# Patient Record
Sex: Female | Born: 1985 | Race: White | Hispanic: No | Marital: Single
Health system: Midwestern US, Community
[De-identification: ages and names within clinical notes are randomized; demographics above are authoritative.]

## PROBLEM LIST (undated history)

## (undated) DIAGNOSIS — Z3044 Encounter for surveillance of vaginal ring hormonal contraceptive device: Secondary | ICD-10-CM

---
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
## 2006-03-25 IMAGING — CT CT MAXILLOFACIAL WITHOUT CONTRAST
2 series · 16 of 40 positions shown, 20 images · non-contrast
Comparison: none

REASON FOR EXAM: Fall, painful face
COMMENTS:  LMP: Two weeks ago

[Series 2: facial 3.0 h60f · axial · 0.30mm/px · z∈[+336,+468]mm · 13 of 52 slices shown, 17 images]
[im 4/52  brain]
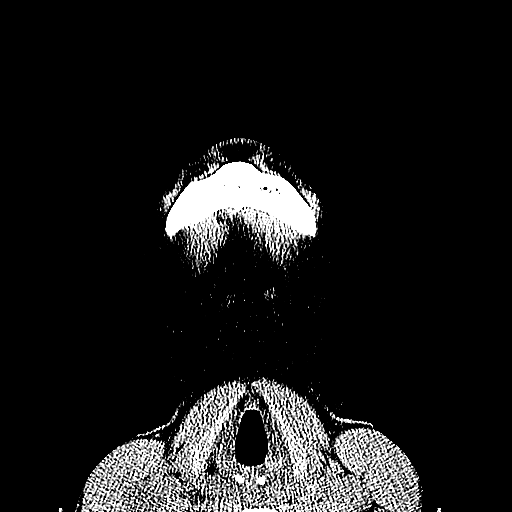
[im 4/52  bone]
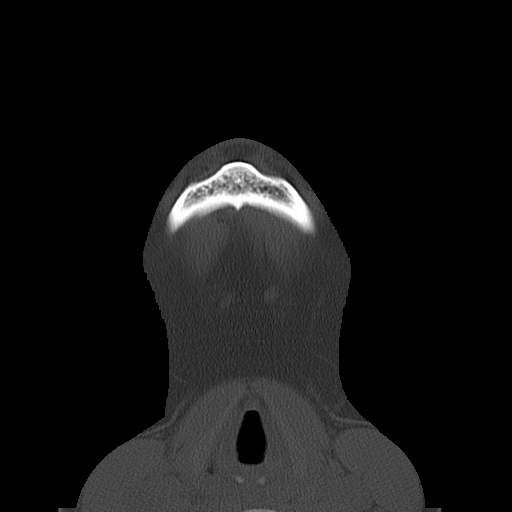
[im 8/52  bone]
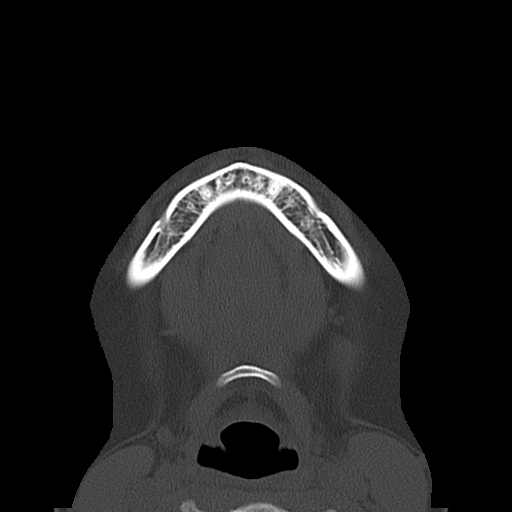
[im 11/52  bone]
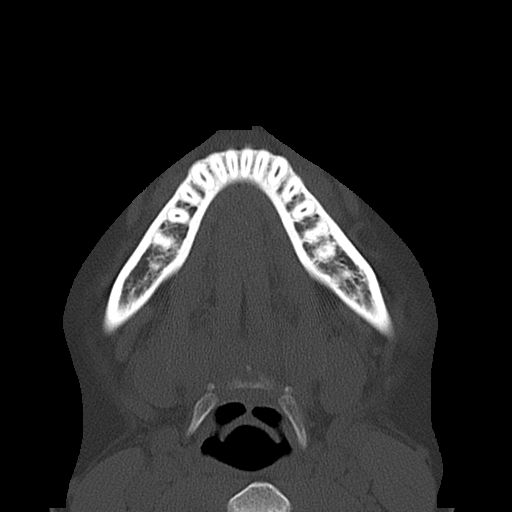
[im 15/52  bone]
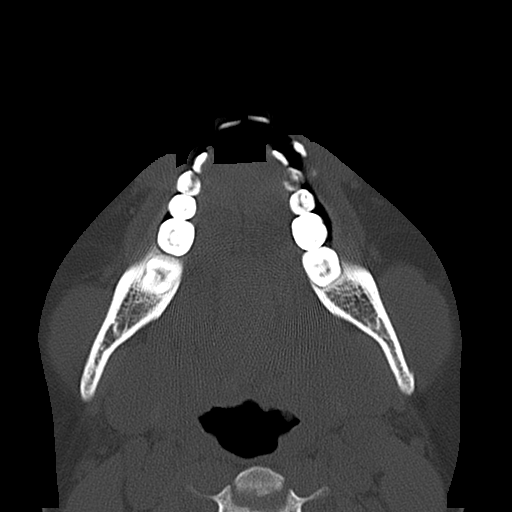
[im 18/52  brain]
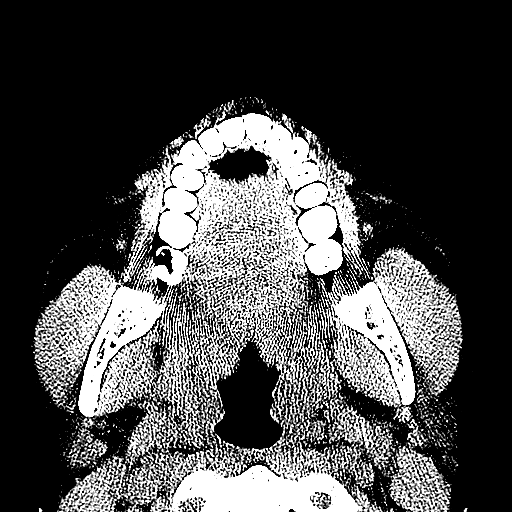
[im 18/52  bone]
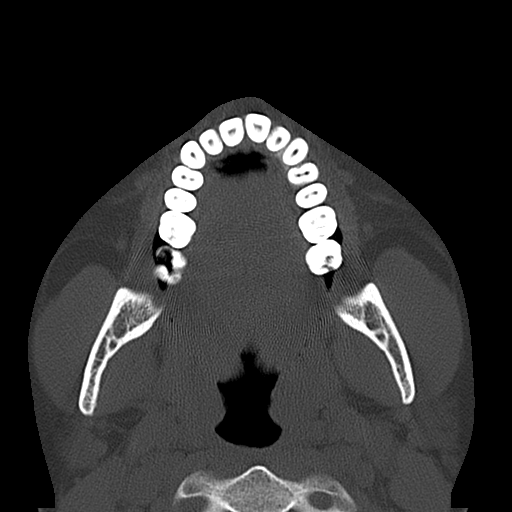
[im 22/52  bone]
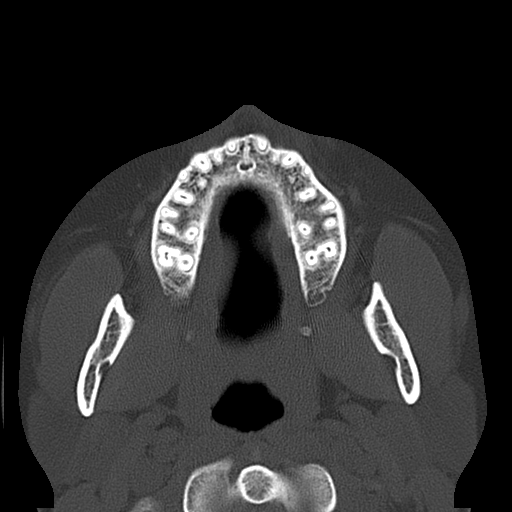
[im 27/52  bone]
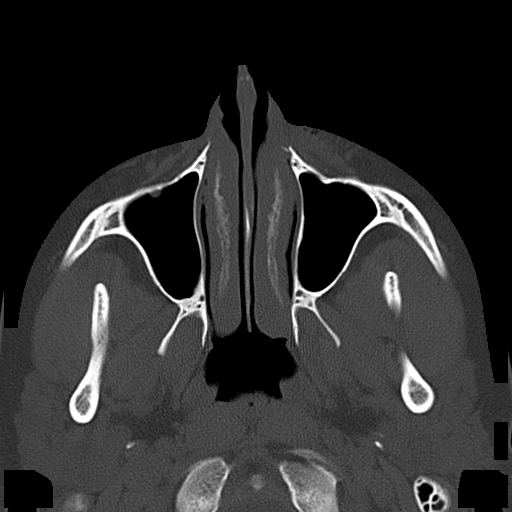
[im 30/52  bone]
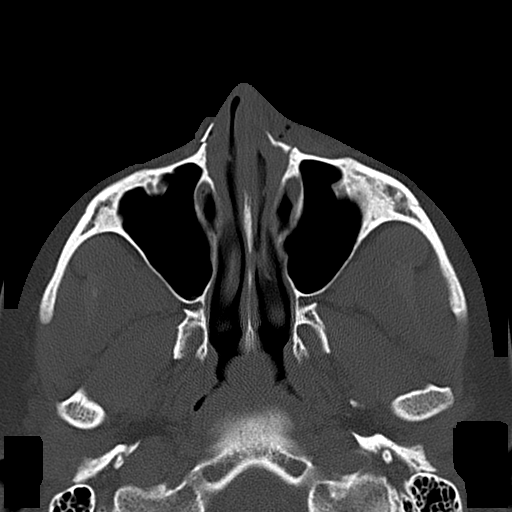
[im 34/52  brain]
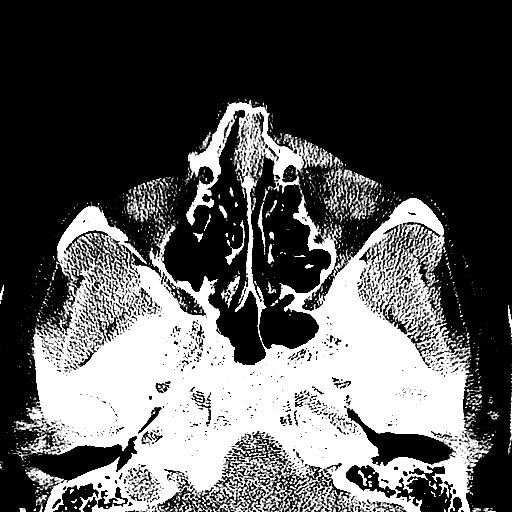
[im 34/52  bone]
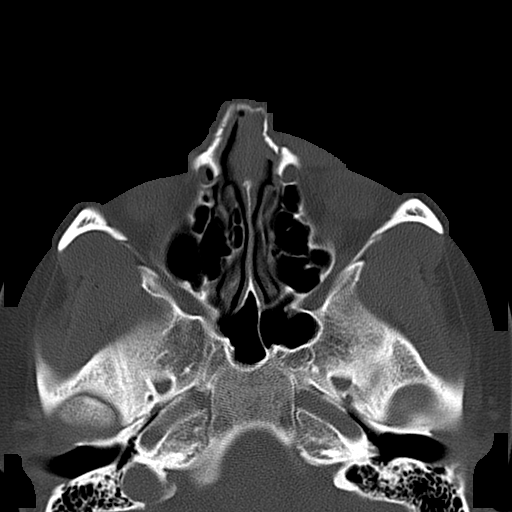
[im 37/52  bone]
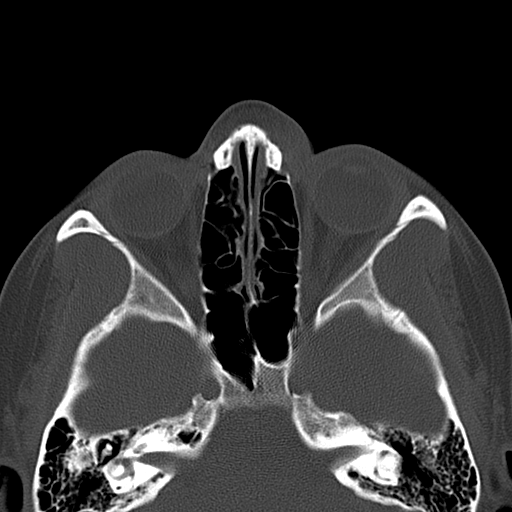
[im 41/52  bone]
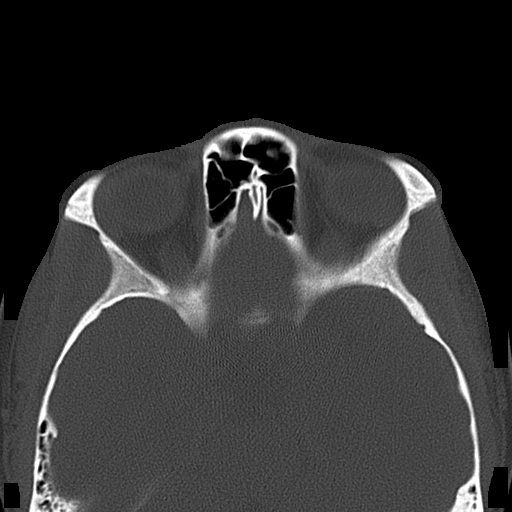
[im 44/52  bone]
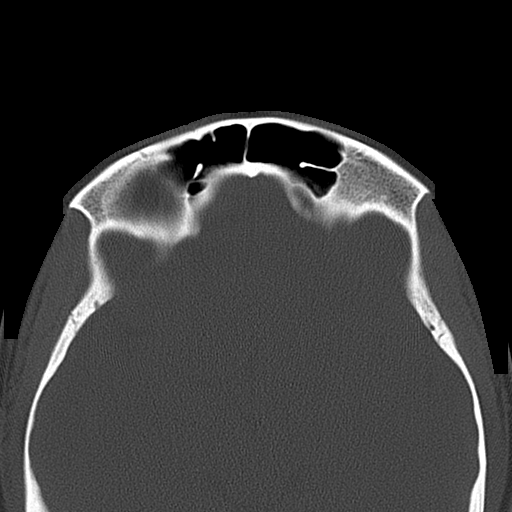
[im 48/52  brain]
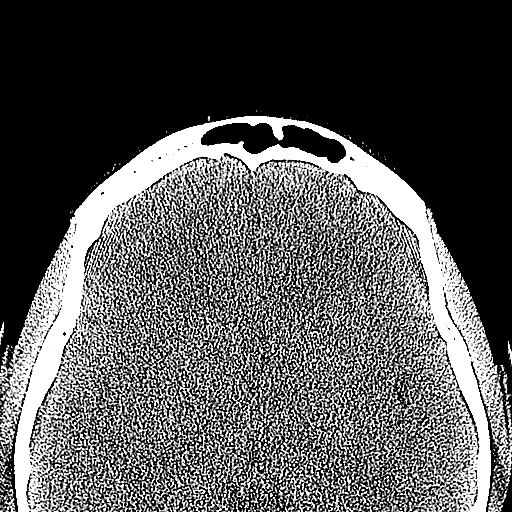
[im 48/52  bone]
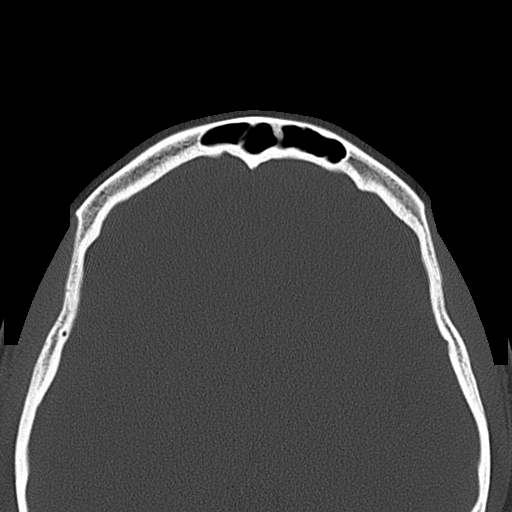

[Series 5: facial 3.0 spo cor · coronal · 0.32mm/px · 3 of 37 slices shown]
[im 13/37  bone]
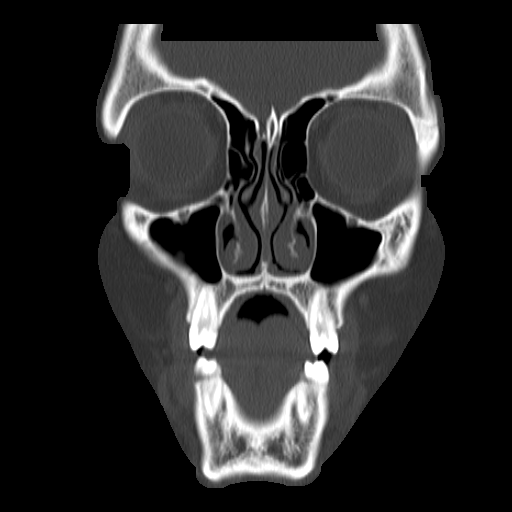
[im 17/37  bone]
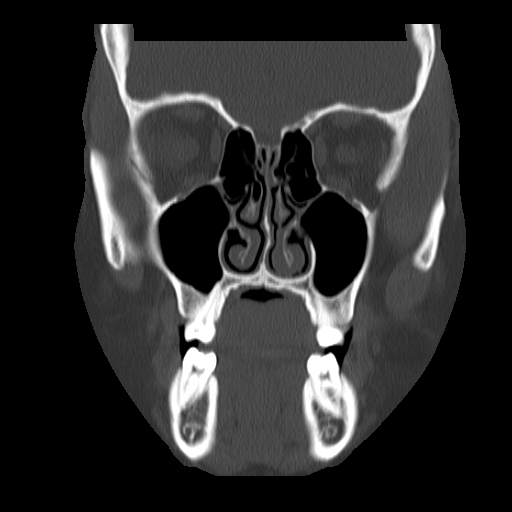
[im 21/37  bone]
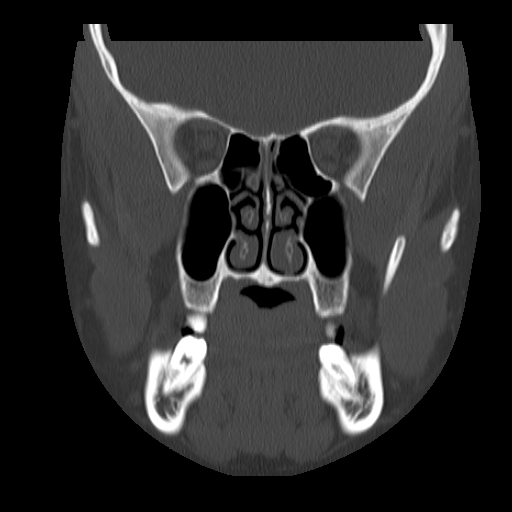

[16 of 40 positions shown; findings below may reference images not displayed]

PROCEDURE:     CT  - CT MAXILLOFACIAL AREA WO  - February 06, 2005  [DATE]

RESULT:     Initial interpretation was rendered by Dr. Charitos Zv of
[REDACTED] on 02/06/2005 at [DATE].

A comminuted, LEFT nasal bone fracture is appreciated which demonstrates
approximately 5.0 mm of depression. A nondepressed, comminuted fracture is
appreciated on the RIGHT. There is a minimally depressed fracture involving
the frontal process of the maxillary bone on the LEFT. There is also a
nondisplaced fracture of the frontal process of the maxillary bone on the
RIGHT. No further fractures or dislocations are appreciated. The paranasal
sinuses are well pneumatized.
IMPRESSION: Nasal bone and frontal process of the maxillary bone
fractures as described above. The fracture on the LEFT is depressed
involving the nasal bone.

## 2006-05-01 ENCOUNTER — Observation Stay: Payer: Self-pay | Admitting: Obstetrics and Gynecology

## 2006-06-28 ENCOUNTER — Inpatient Hospital Stay: Payer: Self-pay | Admitting: Obstetrics and Gynecology

## 2007-06-24 ENCOUNTER — Ambulatory Visit: Payer: Self-pay | Admitting: Internal Medicine

## 2011-12-15 ENCOUNTER — Encounter: Payer: Self-pay | Admitting: Obstetrics & Gynecology

## 2012-05-01 ENCOUNTER — Observation Stay: Payer: Self-pay | Admitting: Obstetrics and Gynecology

## 2012-05-01 LAB — URINALYSIS, COMPLETE
Bilirubin,UR: NEGATIVE
Ketone: NEGATIVE
Ph: 7 (ref 4.5–8.0)
Protein: NEGATIVE
RBC,UR: NONE SEEN /HPF (ref 0–5)
Specific Gravity: 1.002 (ref 1.003–1.030)
Squamous Epithelial: <1

## 2012-06-17 ENCOUNTER — Inpatient Hospital Stay: Payer: Self-pay | Admitting: Obstetrics and Gynecology

## 2012-06-17 LAB — CBC WITH DIFFERENTIAL/PLATELET
Basophil #: 0 10*3/uL (ref 0.0–0.1)
Basophil %: 0.5 %
Eosinophil #: 0.2 10*3/uL (ref 0.0–0.7)
HCT: 29.3 % — ABNORMAL LOW (ref 35.0–47.0)
Lymphocyte #: 1.2 10*3/uL (ref 1.0–3.6)
MCHC: 34.3 g/dL (ref 32.0–36.0)
MCV: 87 fL (ref 80–100)
Monocyte %: 6.3 %
Neutrophil #: 6.2 10*3/uL (ref 1.4–6.5)
Platelet: 164 10*3/uL (ref 150–440)
RDW: 13.5 % (ref 11.5–14.5)
WBC: 8.3 10*3/uL (ref 3.6–11.0)

## 2012-06-17 LAB — PROTEIN / CREATININE RATIO, URINE: Protein/Creat. Ratio: 258 mg/gCREAT — ABNORMAL HIGH (ref 0–200)

## 2012-06-18 LAB — HEMATOCRIT: HCT: 28.5 % — ABNORMAL LOW (ref 35.0–47.0)

## 2013-01-30 IMAGING — US US OB NUCHAL TRANSLUCENCY 1ST GEST - [facility] NRPT
1 series · 14 of 28 positions shown · non-contrast
Comparison: none

[Series 1: us ob nuchal translucency 1st gest - [facility] nrpt · 14 of 32 slices shown]
[im 2/32]
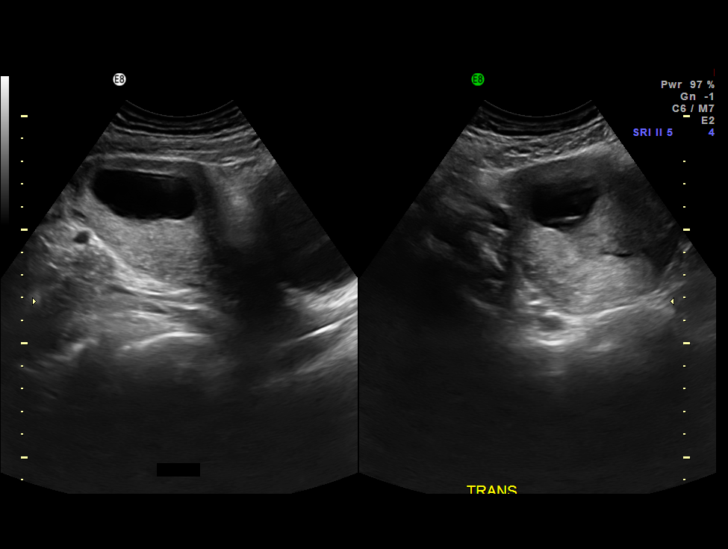
[im 4/32]
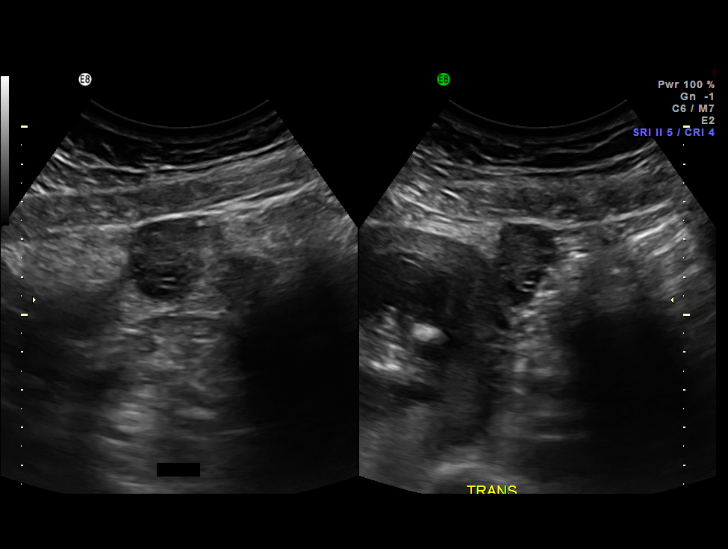
[im 6/32]
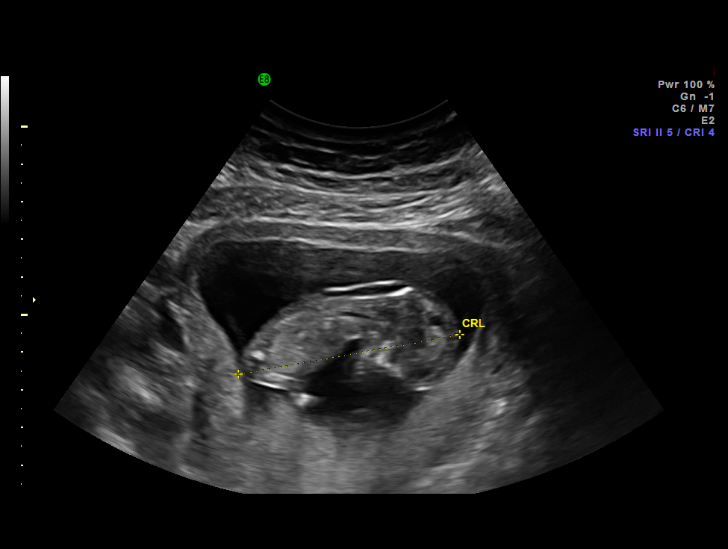
[im 9/32]
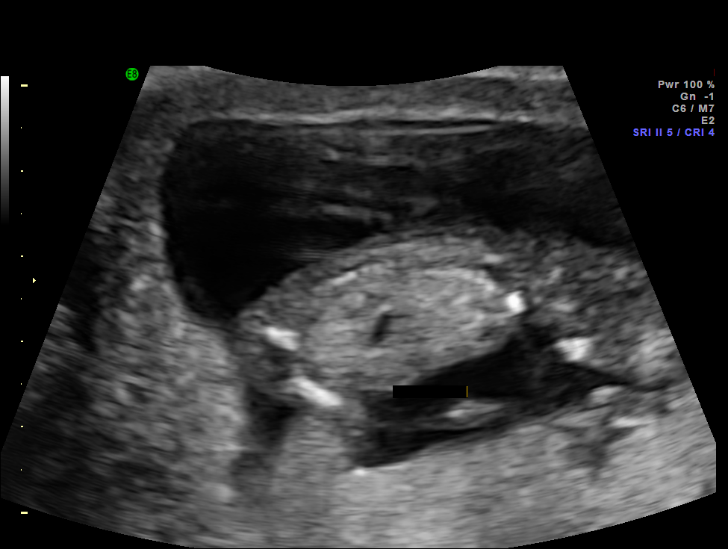
[im 11/32]
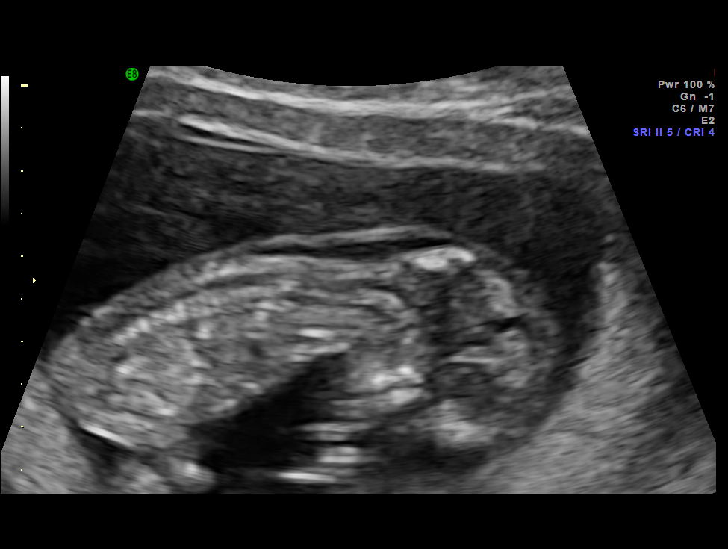
[im 13/32]
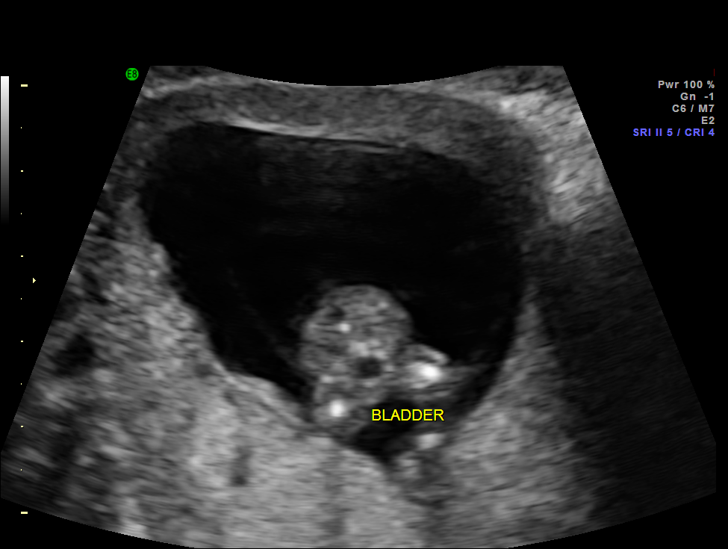
[im 15/32]
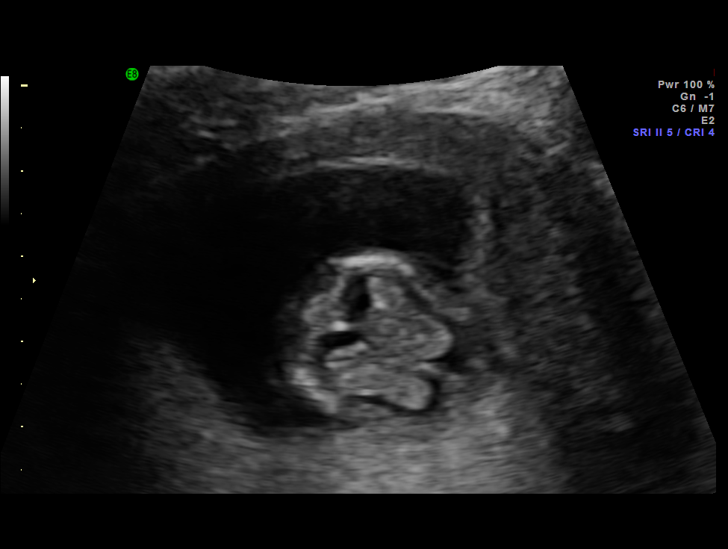
[im 18/32]
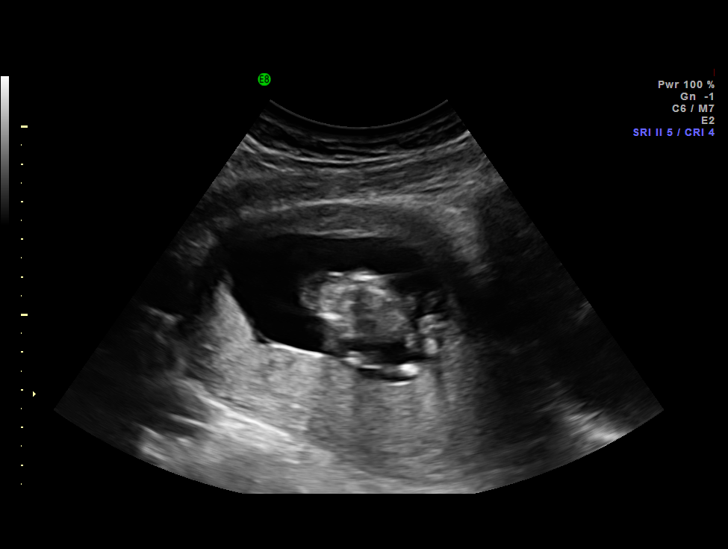
[im 20/32]
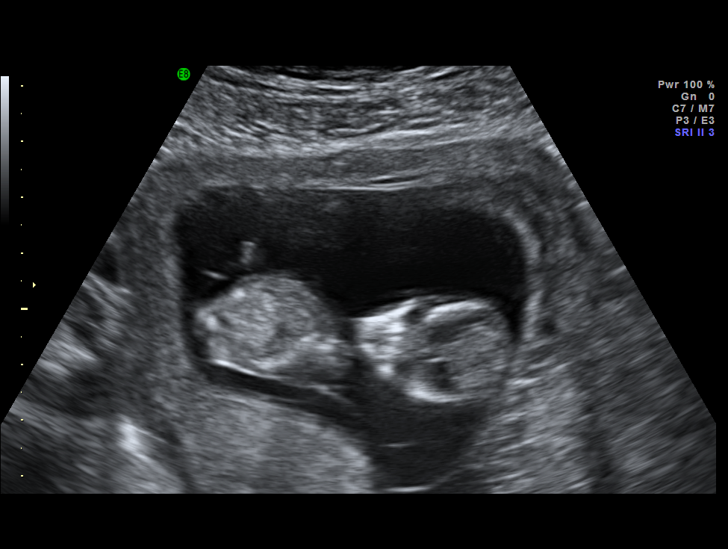
[im 22/32]
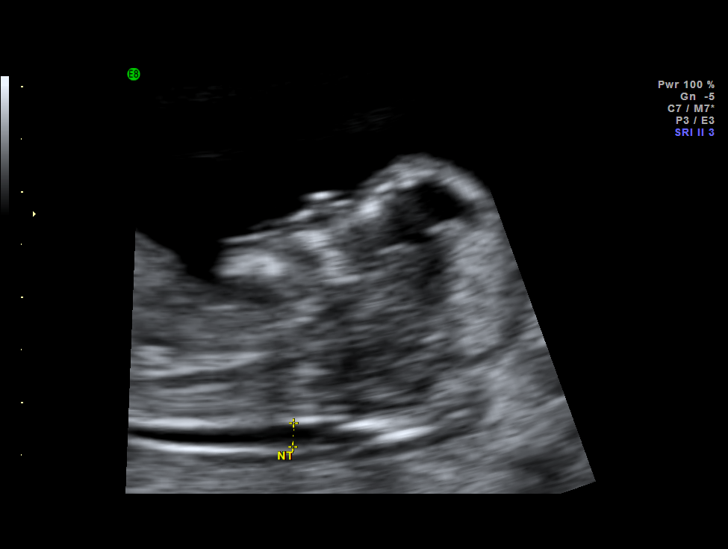
[im 25/32]
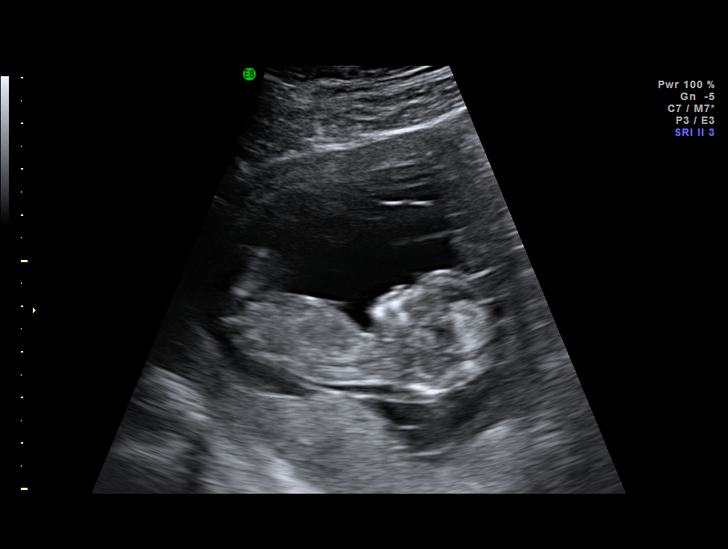
[im 27/32]
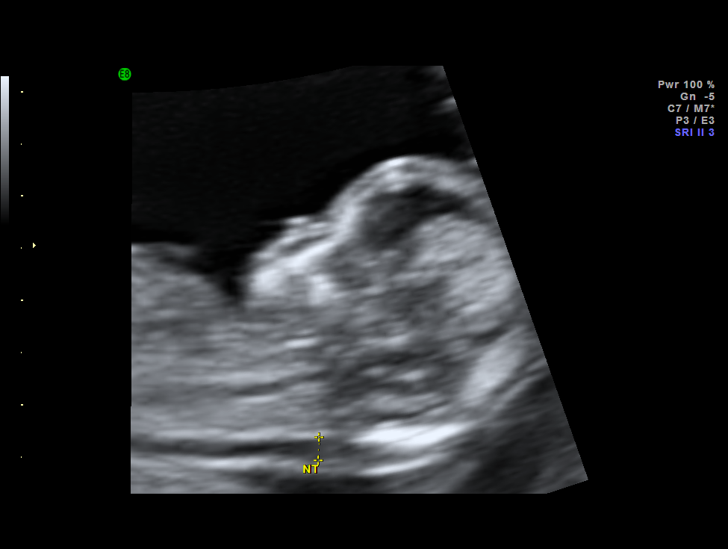
[im 29/32]
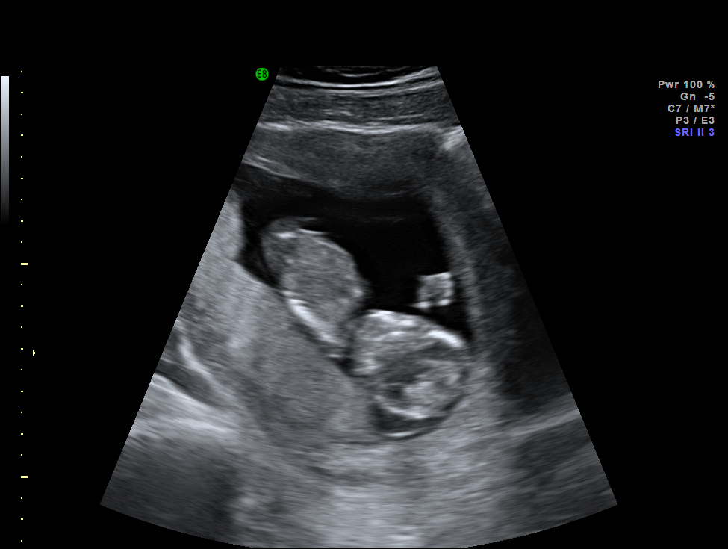
[im 32/32]
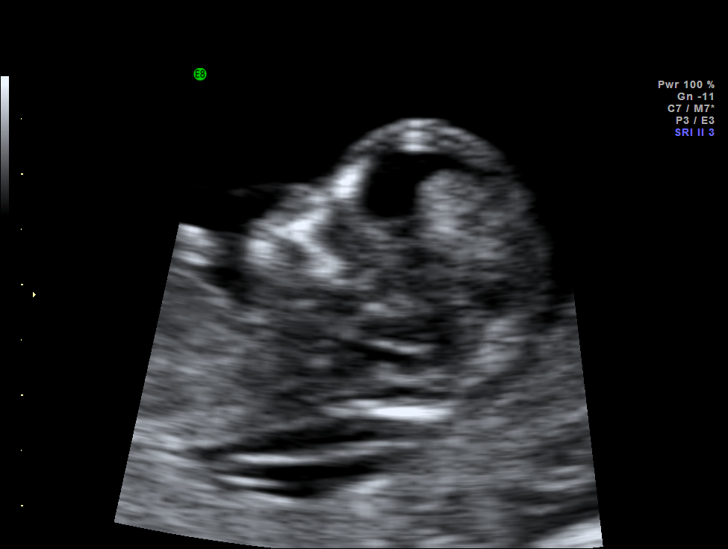

[14 of 28 positions shown; findings below may reference images not displayed]

IMAGES IMPORTED FROM THE SYNGO WORKFLOW SYSTEM
NO DICTATION FOR STUDY

## 2015-02-10 NOTE — H&P (Signed)
L&D Evaluation:  History:   HPI 29 y/o G2P1001 [facility] for scheduled IOL due to elevated blood pressures 140/90 139/90 @ [place] office, denies s sx pre/e, no headache, NV, visual disturbances RUQ or epigastric pain. [facility], denies leaking fluid or vaginal bleeding (small show) baby is active. GBS +    Presents with above    Patient's Medical History No Chronic Illness    Patient's Surgical History none    Medications Pre Natal Vitamins    Allergies NKDA    Social History none    Family History Non-Contributory   ROS:   ROS All systems were reviewed.  HEENT, CNS, GI, GU, Respiratory, CV, Renal and Musculoskeletal systems were found to be normal.   Exam:   Vital Signs stable    Urine Protein to lab    General no apparent distress    Mental Status clear    Chest clear    Heart normal sinus rhythm    Abdomen gravid, tender with contractions    Estimated Fetal Weight Average for gestational age    Fetal Position vtx    Fundal Height term    Back no CVAT    Edema 4+  Pitting    Reflexes 2+    Clonus negative    Pelvic no external lesions, 4cm 50% vtx @ -2 small show BOWI    Mebranes Intact    FHT normal rate with no decels    FHT Description 130's avg variability with accels    Fetal Heart Rate 136    Ucx irregular, q 2/4 mins mild    Skin dry    Lymph no lymphadenopathy   Impression:   Impression IOL elevated B/P @ term   Plan:   Plan antibiotics for GBBS prophylaxis    Comments Will draw PIH labs, protein creatinine ratio. Begin IV ABX and pitocin  (after ABX). Explained what to expect, discussed pain management options, considering epidural. Family supportive at bedside.   Electronic Signatures: Albertina ParrLugiano, Eleni Frank B (CNM)  (Signed 15-Sep-13 11:08)  Authored: L&D Evaluation   Last Updated: 15-Sep-13 11:08 by Albertina ParrLugiano, Emmary Culbreath B (CNM)

## 2016-12-06 NOTE — Procedures (Signed)
Patient: [name]  ID: Lab [id number]  Note: All result statuses are Final unless otherwise noted.    Tests: (1) Pap Smear Cytology Report (Pap Smear)   [facility] Interpretation (see below for details)                              NEG      Patient: Rhonda Wilson, Rhonda Wilson                     Accession Number: [id number]      .               Address: [address]                            SSN: [id number]                        [place]  [address]                     DOB: 05/30/1986                                                            Phone: [phone]                Source: ThinPrep IMAGED                       MR#:              Collected: 12/06/2016  Received: 12/07/2016    Completed: 12/14/2016      Clinical History: No LMP Given, CA Screening      .      Test Results______________________________________________________________                              CYTOLOGY REPORT                          NEG      .      Interpretation:  NEGATIVE FOR INTRAEPITHELIAL LESION OR MALIGNANCY        Smear Quality:   Satisfactory Specimen/Slide Preparation                        Endocervical Component Identified       Recommendation:  Repeat Smears At Your Discretion       .       signed out at:  [facility]                                                  Nicole Peterson CT - 12/14/2016                                    --------------------------------------------                                                             Electronically signed                     technical preparation performed at [facility]      The Pap Smear is a screening test with an irreducible false-negative rate,       the consenquences of which can be minimized by obtaining an annual Pap      Smear.      .      Chart Summary_____________________________________________________________           12/06/2016      [id number]   NEG, Satisfactory Specimen/Slide Preparation, Endocervical Component Identified, Repeat Smears At Your Discretion            08/01/2013    [id number]  NEG, Satisfactory Specimen/Slide Preparation, Endocervical Component Identified, Repeat Smears At Your Discretion      .      Clinician_________________________________________________________________                Farrell OursMaureen Jamiyla Ishee DO                [facility]                [address]                [place], [place]  [id number]                [phone]      .      [facility]  [address]  [place], [place] [id number]  [phone]      .    Note: An exclamation mark (!) indicates a result that was not dispersed into the flowsheet.  Document Creation Date: 12/15/2016 8:40 AM  _______________________________________________________________________    (1) Order result status: Final  Collection or observation date-time: 12/06/2016 00:00:00  Requested date-time: 12/06/2016 00:00:00  Receipt date-time: 12/07/2016 15:06:17  Reported date-time: 12/14/2016 10:42:09  Referring Physician:    Ordering Physician: Farrell OursMaureen Katera Rybka [facility])  [facility]  Source: Lab  Filler Order Number: [id number]  Lab site: Ros[facility] CarpenterSPPA, [facility]      [address]      [place]  [id number]      Electronically Signed by Farrell OursMaureen Shahla Betsill DO on 12/20/2016 at 1:51 PM  ________________________________________________________________________  negative pap. Routine screening recommended. Next pap with HPV testing in 3 years.  Encouraged annual GYN exams.      Electronically Signed by Farrell OursMaureen Norell Brisbin DO on 12/20/2016 at 1:51 PM  ________________________________________________________________________  Normal letter sent.        Electronically Signed by Loyal GamblerJeannine Godin on 12/21/2016 at 8:58 AM  ________________________________________________________________________

## 2017-01-23 NOTE — Progress Notes (Addendum)
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     Last Pap Smear:  NEG  Date:  08/01/2013   LMP: spotting with Nuva ring   Performs monthly SBE     .............................. by Santiago Glad at December 06, 2016 12:05 PM          Visit Type:  Annual Exam  Primary Provider:  Dr. Doroteo Bradford, [place]      History of Present Illness:  31 y/o female    RLQ pain continues  Comes and goes and is worse and severe at times.    Continuous use of Nuvaring   Occ breakthrough bleeding.    Days per week pain:  every day is there but is more uncomfortable at times  Affected by bowel movements and sitting.  Feels pressure when  on right side.   Pain is worse with intercourse - position changes help      Patient Health Questionnaire (PHQ-9) (c) 1999 [employer] Inc[employer]   Usage: DECLINES(patient declines depression screening)      PAP SCREENING  Last Pap: NEG (08/01/2013)    Influenza vaccine not adminstered: vaccine availability      GC/Chlamydia Screen   Is the patient <= 47 years of age? no  Is the patient sexually active? yes    HIV Screen   Is the patient 39-68 years of age? yes    Meds at the time of the visit  NUVARING 0.12-0.015 MG/24HR VAGINAL RING (ETONOGESTREL-ETHINYL ESTRADIOL) Insert 1 ring vaginally Leave in place for 3 weeks then remove for 1 ring free week.  Repeat  * NUVA RING SAMPLE Lot#[id number] 03/20        Allergies at the time of the visit   * CAN TOLERATE PERCOCET (Critical)  CODEINE (Severe)  HYDROCODONE (Severe)        Past Medical History:     Reviewed history from 12/02/2015 and no changes required:        Gardisil 2012- 3 shots          Lactose Intolerance                 Anxiety / Depression - hx of cutting as a child - feels very stable now        Chronic Upper Right Neck Pain     Past Surgical History:     Reviewed history from 02/20/2014 and no changes required:        SVD - 25 Sept 2008        Laparoscopic Right Ovarian Cystectomy -  Oct 2013 - [place]        Wisdom teeth extraction - 2010         Dental Extraction - July 2014        Recovers very slowly from anesthesia          c-section 02/05/2014    Family History Summary:      Reviewed history Last on 02/03/2015 and no changes required:12/06/2016  Mother Baker Pierini.) - Has Family History Unknown - Entered On: 09/03/2013  MGF - Has Family History of Melanoma - Entered On: 07/23/2013  MGM - Has Family History of Diabetes - Entered On: 07/23/2013  MGF - Has Family History of Heart Disease - Entered On: 07/23/2013    General Comments - FH:  FH of cervical cancer -  MGM / MGGM / Maternal Great Aunts    Denies FH of Breast or Ovarian Cancer     Social History:  Reviewed history from 12/02/2015 and no changes required:        Lives with boyfriend Minerva Areola, together since Spring 2014 -  7 yo daughter Jovita Gamma, and Ladona Horns two kids sometimes during the week        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      Vital Signs   Weight: 191 pounds Change since last visit: -11 pounds   BMI: 32.78  Blood Pressure: 122/70 mm Hg  Cuff Size: adult    Nursing Documentation   Vitals performed and patient identified by full name and DOB: Meryle Ready MA December 06, 2016 11:49 AM  Medications reviewed: Meryle Ready MA December 06, 2016 11:49 AM  Allergies reviewed: Meryle Ready MA December 06, 2016 11:49 AM  Influenza vaccine not adminstered: vaccine availability  6    Physical Exam    General:      well developed, well nourished, in no acute distress  Neck:      no masses, thyromegaly, or abnormal cervical nodes  Breasts:      no masses, adenopathy or nipple discharge.    Respiratory:      resp unlabored  Heart:      regular rate  Genitalia:         GU: normal appearing external genitalia, normal appearing, pink vaginal mucusa with rugae, no lesions  Cervix: smooth, pink no evidence of lesions, no CMT  Uterus: non tender with palpation  Adnexa: no discrete masses,  Tenderness in RLQ just above pubic bone         Blood Pressure:  Today's BP: 122/70 mm Hg        Process Orders  Check Orders Results:      Lab (printed in practice): Order checked:       -- Chlamydia GC Molecular    (CTNG) --  [NO CODE FOUND]   Tests Sent for requisitioning (December 06, 2016 12:26 PM):      12/06/2016: Lab (printed in practice) -- Chlamydia GC Molecular    (CTNG) [phone] x2] (signed)              Assessment and Plan:    Problem # 1:  PREVENTIVE HEALTH CARE (ICD-V70.0) (ICD10-Z00.00)  Routien care  STD screen done  Pap completed    Well women exam completed.    Reviewed healthy habits including diet and exercise  Encourage annual GYN exams      Problem # 2:  PELVIC PAIN- RIGHT (ICD-625.9) (ICD10-R10.2)  repeat pelvic US  ?endometriosis/endometrioma.  Will review findings by phone        Orders:  [place] [id number]     Preventative 18 to 39 yrs   [phone]) - Signed  Added new Test order of Chlamydia GC Molecular    (  CTNG) [phone] x2) - Signed  Added new Test order of Pelvis Transabdominal             (Ultrasound) ([address]14) - Signed      ]      Electronically Signed by Farrell Ours DO on 12/06/2016 at 12:28 PM  ________________________________________________________________________

## 2017-01-23 NOTE — Procedures (Addendum)
Patient: Rhonda Wilson  ID: Lab [id number]  Note: All result statuses are Final unless otherwise noted.    Tests: (1) Pap Smear Cytology Report (Pap Smear)   [facility] Interpretation (see below for details)                              NEG      Patient: Rhonda Wilson, Rhonda Wilson                     Accession Number: [id number]      .               Address: [address]                            SSN: [id number]                        [place], [place]  [address]                     DOB: April 05, 1986                                                            Phone: [phone]                Source: ThinPrep IMAGED                       MR#:              Collected: 12/06/2016  Received: 12/07/2016    Completed: 12/14/2016      Clinical History: No LMP Given, CA Screening      .      Test Results______________________________________________________________                              CYTOLOGY REPORT                          NEG      .      Interpretation:  NEGATIVE FOR INTRAEPITHELIAL LESION OR MALIGNANCY        Smear Quality:   Satisfactory Specimen/Slide Preparation                        Endocervical Component Identified       Recommendation:  Repeat Smears At Your Discretion       .       signed out at:  [facility]                                                  Cherlynn Polo CT - 12/14/2016                                    --------------------------------------------  Electronically signed                     technical preparation performed at [facility]      The Pap Smear is a screening test with an irreducible false-negative rate,       the consenquences of which can be minimized by obtaining an annual Pap      Smear.      .      Chart Summary_____________________________________________________________           12/06/2016      [id number]   NEG, Satisfactory Specimen/Slide Preparation, Endocervical Component Identified, Repeat Smears At Your Discretion            08/01/2013    [id number]  NEG, Satisfactory Specimen/Slide Preparation, Endocervical Component Identified, Repeat Smears At Your Discretion      .      Clinician_________________________________________________________________                Farrell Ours DO                [facility]                [address]                [place], [place]  [address]                [phone]      .      [facility]  [address]  [address]  [place], [place] [address]  [phone]      .    Note: An exclamation mark (!) indicates a result that was not dispersed into the flowsheet.  Document Creation Date: 12/15/2016 8:40 AM  _______________________________________________________________________    (1) Order result status: Final  Collection or observation date-time: 12/06/2016 00:00:00  Requested date-time: 12/06/2016 00:00:00  Receipt date-time: 12/07/2016 15:06:17  Reported date-time: 12/14/2016 10:42:09  Referring Physician:    Ordering Physician: Farrell Ours [facility])  Specimen Source: [facility] ThinPrep IMAGED  Source: Lab  Filler Order Number: [id number]  Lab site: [facility]      [address]      [place]  [address]      Electronically Signed by Farrell Ours DO on 12/20/2016 at 1:51 PM  ________________________________________________________________________  negative pap. Routine screening recommended. Next pap with HPV testing in 3 years.  Encouraged annual GYN exams.      Electronically Signed by Farrell Ours DO on 12/20/2016 at 1:51 PM  ________________________________________________________________________  Normal letter sent.        Electronically Signed by Loyal Gambler on 12/21/2016 at 8:58 AM  ________________________________________________________________________

## 2017-03-27 NOTE — Telephone Encounter (Addendum)
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    Contact:  [phone]  Caller understands that [name]  will review for recommendations.

## 2017-03-27 NOTE — Telephone Encounter (Signed)
Please clarify as this is information on a Thyroid US, not pelvic.   Her PCP will need to address this    Ovidio KinMaureen E Bronnie Vasseur, DO  03/27/17  5:10 PM

## 2017-03-28 NOTE — Telephone Encounter (Signed)
Pt name and DOB confirmed by Jacki ConesLaurie at [facility]  Pt of [name]   Jacki ConesLaurie says the thyroid result below were actually intended for this pt.  Names got reversed.     This pt has not had any additional imaging pelvic ultrasound or thyroid ultrasound since March 2018.

## 2017-03-28 NOTE — Telephone Encounter (Signed)
Message entered in error    Ovidio KinMaureen E Chyanna Flock, DO  03/28/17  2:43 PM

## 2017-03-28 NOTE — Telephone Encounter (Signed)
Left message for [facility]   Need confirmation of results.     Rhonda GammaErin Wilson Rhonda Wilson, Certified Medical Assistant  03/28/17  2:00 PM

## 2017-03-28 NOTE — Telephone Encounter (Signed)
Pt name and DOB confirmed by Jacki ConesLaurie at [facility]  Pt of [name]   Calling to clarify message from Dasylva below.  Jacki ConesLaurie says this pt was last seen for pelvic ultrasound on 12/29/16.  She will have to investigate further and call back with clarification  Contact:  [phone]    Caller understands that   [name] will review for recommendations.

## 2017-06-07 ENCOUNTER — Encounter

## 2017-06-07 MED ORDER — ETONOGESTREL 0.12 MG-ETHINYL ESTRADIOL 0.015 MG/24 HR VAGINAL RING
VAGINAL | 0 refills | Status: DC
Start: 2017-06-07 — End: 2017-08-16

## 2017-06-07 NOTE — Telephone Encounter (Signed)
Patient ok to pick up nuvaring samples  Dispense #3    Ovidio KinMaureen E Paisely Brick, DO  06/07/17  4:00 PM

## 2017-06-07 NOTE — Telephone Encounter (Signed)
Pt name and DOB confirmed.  Pt of  Dr. Vedia PereyraPerdue    Pt calling she is on [place] [facility] and her birth Control is going to cost her 160.00 out of pocket. She can't afford this, she would like to know if we can do anything for her.  We do have samples of this in the refrigerator.  Please advise.    Contact: [phone]  Pt understands that  will review for recommendations.

## 2017-06-07 NOTE — Telephone Encounter (Addendum)
Patient will pick up in the morning.   Please sign sample script   Jovita Gammarin S Zanyah Lentsch, CMA  06/07/17  4:16 PM

## 2017-08-16 ENCOUNTER — Encounter

## 2017-08-16 MED ORDER — NORGESTIMATE-ETHINYL ESTRADIOL 0.25 MG-35 MCG TAB
Freq: Every day | ORAL | 0 refills | Status: AC
Start: 2017-08-16 — End: 2017-09-15

## 2017-08-16 MED ORDER — ETONOGESTREL 0.12 MG-ETHINYL ESTRADIOL 0.015 MG/24 HR VAGINAL RING
VAGINAL | 0 refills | Status: DC
Start: 2017-08-16 — End: 2017-11-03

## 2017-08-16 NOTE — Telephone Encounter (Signed)
Patient is requesting an OCP to pay out of pocket as she awaits for the Insurance to kick in, not a refill of Nuva Ring.       Please advise.     Rhonda Wilson, CMA  08/16/17  3:48 PM

## 2017-08-16 NOTE — Telephone Encounter (Signed)
Above noted -  Please advise her that Nuvaring RX refill has been e-faxed for one month as directed.  Please advise her to phone the office with concerns / questions / worsening of her symptoms.    Thank you.   Mosetta Pigeonobert Niclas Markell, DO   08/16/17 - 3:30 PM

## 2017-08-16 NOTE — Telephone Encounter (Signed)
[facility] [address] called and stated the two prescriptions that are compatable to the Nuva ring and cheaper are Sprintec or Tri-Sprintec.  Please advise

## 2017-08-16 NOTE — Telephone Encounter (Signed)
Above noted -  Please advise her that Sprintec RX has been e-faxed as directed.  Please advise her to phone the office with concerns / questions / worsening of her symptoms.    Thank you.   Mosetta Pigeonobert Jehieli Brassell, DO   08/16/17 - 5:06 PM

## 2017-08-16 NOTE — Telephone Encounter (Signed)
Pt name and DOB confirmed.  Pt of Maureen Perdue DO   Pt had called for more Nuvaring samples but was advised    Pt spoke with pharmacist.  Pt is between insurances right now.  Pt due for new Nuvaring on Friday.  New insurance does no start again until 09/02/17.  Pt spoke with her pharmacist but not able to get any help there although pharmacist suggested asking [name] is she could prescribe and interim OCP to hold pt until her insurance kicks in.  Pt will ask pharmacist to [facility] with those options with similar hormones that will only cost pt ~ $20.    [facility] [address]  Contact:  [phone]  Pt understands that oncall Mosetta Pigeonobert Tardif DO   will review for recommendations.

## 2017-08-16 NOTE — Telephone Encounter (Signed)
Above noted -  Please advise her that RX for Sprintec has been e-faxed as directed.  Please advise her to phone the office with concerns / questions / worsening of her symptoms.    Thank you.   Mosetta Pigeonobert Verdon Ferrante, DO   08/16/17 - 5:05 PM

## 2017-11-03 ENCOUNTER — Encounter

## 2017-11-03 MED ORDER — ETONOGESTREL 0.12 MG-ETHINYL ESTRADIOL 0.015 MG/24 HR VAGINAL RING
VAGINAL | 6 refills | Status: DC
Start: 2017-11-03 — End: 2018-06-07

## 2017-11-03 NOTE — Telephone Encounter (Signed)
.  Med Nuva Ring  Last Refill:08-2017  Last Appt. 12-06-2016  Next Appt: Will call back to schedule  [facility] [address]  Contact: [phone]

## 2017-11-09 NOTE — Telephone Encounter (Signed)
Medication sent 11/03/17

## 2018-02-28 ENCOUNTER — Inpatient Hospital Stay
Admit: 2018-02-28 | Discharge: 2018-02-28 | Disposition: A | Discharge status: Home / Self Care | Payer: PRIVATE HEALTH INSURANCE | Attending: Family

## 2018-02-28 DIAGNOSIS — J020 Streptococcal pharyngitis: Secondary | ICD-10-CM

## 2018-02-28 LAB — POC GROUP A STREP
GROUP A STREP SCREEN:: POSITIVE
Group A strep (POC): POSITIVE
KIT LOT NO.:: 8181534 NA
Lot no.: 8181534

## 2018-02-28 MED ORDER — PENICILLIN V POTASSIUM 250 MG/5 ML ORAL SUSP
250 mg/5 mL | Freq: Two times a day (BID) | ORAL | 0 refills | Status: AC
Start: 2018-02-28 — End: 2018-03-10

## 2018-02-28 NOTE — Other (Signed)
Pt states runny nose and sore throat deep voice x3 days aprox.

## 2018-02-28 NOTE — Other (Signed)
32 year old female presents for evaluation of nasal congestion and sore throat. She reports symptoms started 3 days ago and have been worsening.  She reports it is very painful to swallow though she is not experiencing difficulty swallowing or controlling her oral secretions.  No difficulty opening her mouth.   She has not had any fevers, chills, or sweats. She states her voice feels deeper than normal, due to the pain and postnasal drip. She denies sinus pain or facial pain or swelling.  Denies neck stiffness. Denies ear pain.  Denies cough or chest congestion.  She has no abdominal pain, nausea, or changes in bowels.  She vomited once the other day, but has not vomited again since then.  She thinks it was likely due to her stomach being upset from a milkshake.              No skin rashes.  She is otherwise healthy.   Her son has had similar symptoms for the past week.               History reviewed. No pertinent past medical history.     History reviewed. No pertinent surgical history.      History reviewed. No pertinent family history.     Social History     Socioeconomic History   ??? Marital status: SINGLE     Spouse name: Not on file   ??? Number of children: Not on file   ??? Years of education: Not on file   ??? Highest education level: Not on file   Occupational History   ??? Not on file   Social Needs   ??? Financial resource strain: Not on file   ??? Food insecurity:     Worry: Not on file     Inability: Not on file   ??? Transportation needs:     Medical: Not on file     Non-medical: Not on file   Tobacco Use   ??? Smoking status: Current Every Day Smoker     Packs/day: 0.25   ??? Smokeless tobacco: Never Used   Substance and Sexual Activity   ??? Alcohol use: Yes     Comment: rarely   ??? Drug use: Yes     Types: Marijuana   ??? Sexual activity: Not on file   Lifestyle   ??? Physical activity:     Days per week: Not on file     Minutes per session: Not on file   ??? Stress: Not on file   Relationships   ??? Social connections:      Talks on phone: Not on file     Gets together: Not on file     Attends religious service: Not on file     Active member of club or organization: Not on file     Attends meetings of clubs or organizations: Not on file     Relationship status: Not on file   ??? Intimate partner violence:     Fear of current or ex partner: Not on file     Emotionally abused: Not on file     Physically abused: Not on file     Forced sexual activity: Not on file   Other Topics Concern   ??? Not on file   Social History Narrative   ??? Not on file                ALLERGIES: Codeine and Hydrocodone    Review of Systems   Constitutional:  Negative for chills, diaphoresis and fever.   HENT: Positive for congestion, rhinorrhea, sinus pressure and sore throat. Negative for drooling, ear discharge, ear pain, facial swelling, sinus pain and trouble swallowing.    Eyes: Negative.    Respiratory: Negative.    Cardiovascular: Negative.    Gastrointestinal: Negative.    Skin: Negative for rash.       Vitals:    02/28/18 0939   BP: 114/62   Pulse: 94   Resp: 16   Temp: 98.2 ??F (36.8 ??C)   SpO2: 97%       Physical Exam   Nursing note and vitals reviewed.    GENERAL: well-developed, well-nourished, in no distress  HEAD: atraumatic, normocephalic,  non-tender frontal sinuses & maxillary sinuses on percussion;  EYES: PERRL, no eye discharge or injection  EARS: bilateral tympanic membranes pearly gray, non-bulging, normal light reflex; no tenderness, edema, or erythema over the mastoids bilaterally, no tenderness of the tragus , helix, or pinna; ear canal is nonerythematous & nonedematous bilaterally; no pre-auricular or postauricular lymphadenopathy  NOSE: nasal turbinates swollen and red  OROPHARYNX:   Tonsils erythematous and 2+ bilaterally with exudates present  bilaterally; buccal mucosa moist, oropharynx clear; no posterior pharyngeal cobblestoning; uvula midline, airway patent  MOUTH: No gingival edema or erythema.   NECK:  Positive anterior cervical lymphadenopathy bilaterally; supple, full ROM, no stridor, no adenopathy, non-tender,   LUNGS: clear to auscultation bilaterally, no wheezes, rales or rhonchi;   HEART: regular rate and rhythm;   ABDOMEN: Soft & nontender;   EXTREMITY: No peripheral edema; capillary refill <2 seconds  SKIN: Warm and dry; no rashes        MDM  Number of Diagnoses or Management Options  Acute streptococcal pharyngitis:   Diagnosis management comments: Patient afebrile, vitals are stable, patient is nontoxic appearing, and in no acute distress.  Patient appears well hydrated on exam.    Rapid strep positive.  She has no signs of peritonsillar abscess, retropharyngeal abscess, or deeper tissue infection on examination. She has no trismus.  She is controlling her oral secretions without difficulty.  She has cervical lymphadenopathy.  No nuchal rigidity.   Exam is otherwise unremarkable and reassuring.  No evidence of bacterial sinusitis or otitis media.  Lung sounds clear and equal without any respiratory distress. Abdomen is soft and nontender.    Patient will be started on oral penicillin.  Advised to do salt water gargles, Tylenol, and ibuprofen as needed for pain and fevers.    Medication risks,  benefits, actions, and common side effects were reviewed with the patient.      I reviewed return precautions and reasons to seek emergent medical attention. I answered all questions. Patient voiced understanding and agreement with this plan.           Risk of Complications, Morbidity, and/or Mortality  Presenting problems: moderate  Management options: moderate    Patient Progress  Patient progress: stable             Procedures

## 2018-02-28 NOTE — ED Provider Notes (Signed)
32 year old female presents for evaluation of nasal congestion and sore throat. She reports symptoms started 3 days ago and have been worsening.  She reports it is very painful to swallow though she is not experiencing difficulty swallowing or controlling her oral secretions.  No difficulty opening her mouth.   She has not had any fevers, chills, or sweats. She states her voice feels deeper than normal, due to the pain and postnasal drip. She denies sinus pain or facial pain or swelling.  Denies neck stiffness. Denies ear pain.  Denies cough or chest congestion.  She has no abdominal pain, nausea, or changes in bowels.  She vomited once the other day, but has not vomited again since then.  She thinks it was likely due to her stomach being upset from a milkshake.              No skin rashes.  She is otherwise healthy.   Her son has had similar symptoms for the past week.               History reviewed. No pertinent past medical history.     History reviewed. No pertinent surgical history.      History reviewed. No pertinent family history.     Social History     Socioeconomic History   ??? Marital status: SINGLE     Spouse name: Not on file   ??? Number of children: Not on file   ??? Years of education: Not on file   ??? Highest education level: Not on file   Occupational History   ??? Not on file   Social Needs   ??? Financial resource strain: Not on file   ??? Food insecurity:     Worry: Not on file     Inability: Not on file   ??? Transportation needs:     Medical: Not on file     Non-medical: Not on file   Tobacco Use   ??? Smoking status: Current Every Day Smoker     Packs/day: 0.25   ??? Smokeless tobacco: Never Used   Substance and Sexual Activity   ??? Alcohol use: Yes     Comment: rarely   ??? Drug use: Yes     Types: Marijuana   ??? Sexual activity: Not on file   Lifestyle   ??? Physical activity:     Days per week: Not on file     Minutes per session: Not on file   ??? Stress: Not on file   Relationships   ??? Social connections:      Talks on phone: Not on file     Gets together: Not on file     Attends religious service: Not on file     Active member of club or organization: Not on file     Attends meetings of clubs or organizations: Not on file     Relationship status: Not on file   ??? Intimate partner violence:     Fear of current or ex partner: Not on file     Emotionally abused: Not on file     Physically abused: Not on file     Forced sexual activity: Not on file   Other Topics Concern   ??? Not on file   Social History Narrative   ??? Not on file                ALLERGIES: Codeine and Hydrocodone    Review of Systems   Constitutional:  Negative for chills, diaphoresis and fever.   HENT: Positive for congestion, rhinorrhea, sinus pressure and sore throat. Negative for drooling, ear discharge, ear pain, facial swelling, sinus pain and trouble swallowing.    Eyes: Negative.    Respiratory: Negative.    Cardiovascular: Negative.    Gastrointestinal: Negative.    Skin: Negative for rash.       Vitals:    02/28/18 0939   BP: 114/62   Pulse: 94   Resp: 16   Temp: 98.2 ??F (36.8 ??C)   SpO2: 97%       Physical Exam   Nursing note and vitals reviewed.    GENERAL: well-developed, well-nourished, in no distress  HEAD: atraumatic, normocephalic,  non-tender frontal sinuses & maxillary sinuses on percussion;  EYES: PERRL, no eye discharge or injection  EARS: bilateral tympanic membranes pearly gray, non-bulging, normal light reflex; no tenderness, edema, or erythema over the mastoids bilaterally, no tenderness of the tragus , helix, or pinna; ear canal is nonerythematous & nonedematous bilaterally; no pre-auricular or postauricular lymphadenopathy  NOSE: nasal turbinates swollen and red  OROPHARYNX:   Tonsils erythematous and 2+ bilaterally with exudates present  bilaterally; buccal mucosa moist, oropharynx clear; no posterior pharyngeal cobblestoning; uvula midline, airway patent  MOUTH: No gingival edema or erythema.  NECK:  Positive anterior cervical  lymphadenopathy bilaterally; supple, full ROM, no stridor, no adenopathy, non-tender,   LUNGS: clear to auscultation bilaterally, no wheezes, rales or rhonchi;   HEART: regular rate and rhythm;   ABDOMEN: Soft & nontender;   EXTREMITY: No peripheral edema; capillary refill <2 seconds  SKIN: Warm and dry; no rashes        MDM  Number of Diagnoses or Management Options  Acute streptococcal pharyngitis:   Diagnosis management comments: Patient afebrile, vitals are stable, patient is nontoxic appearing, and in no acute distress.  Patient appears well hydrated on exam.    Rapid strep positive.  She has no signs of peritonsillar abscess, retropharyngeal abscess, or deeper tissue infection on examination. She has no trismus.  She is controlling her oral secretions without difficulty.  She has cervical lymphadenopathy.  No nuchal rigidity.   Exam is otherwise unremarkable and reassuring.  No evidence of bacterial sinusitis or otitis media.  Lung sounds clear and equal without any respiratory distress. Abdomen is soft and nontender.    Patient will be started on oral penicillin.  Advised to do salt water gargles, Tylenol, and ibuprofen as needed for pain and fevers.    Medication risks,  benefits, actions, and common side effects were reviewed with the patient.      I reviewed return precautions and reasons to seek emergent medical attention. I answered all questions. Patient voiced understanding and agreement with this plan.           Risk of Complications, Morbidity, and/or Mortality  Presenting problems: moderate  Management options: moderate    Patient Progress  Patient progress: stable             Procedures

## 2018-02-28 NOTE — ED Notes (Signed)
Pt states runny nose and sore throat deep voice x3 days aprox.

## 2018-06-07 ENCOUNTER — Encounter

## 2018-06-11 MED ORDER — ETONOGESTREL 0.12 MG-ETHINYL ESTRADIOL 0.015 MG/24 HR VAGINAL RING
VAGINAL | 4 refills | Status: AC
Start: 2018-06-11 — End: ?

## 2018-09-09 ENCOUNTER — Inpatient Hospital Stay
Admit: 2018-09-09 | Discharge: 2018-09-09 | Disposition: A | Discharge status: Home / Self Care | Payer: PRIVATE HEALTH INSURANCE | Attending: Family

## 2018-09-09 DIAGNOSIS — J36 Peritonsillar abscess: Secondary | ICD-10-CM

## 2018-09-09 LAB — POC GROUP A STREP
GROUP A STREP SCREEN:: NEGATIVE
Group A strep (POC): NEGATIVE
KIT LOT NO.:: 8191206 NA
Lot no.: 8191206

## 2018-09-09 MED ORDER — LACTOBACILLUS RHAMNOSUS GG 10 BILLION CELL-INULIN 200 MG CAPSULE
10 billion cell -200 mg | ORAL_CAPSULE | Freq: Every day | ORAL | 0 refills | Status: AC
Start: 2018-09-09 — End: 2018-12-08

## 2018-09-09 MED ORDER — CLINDAMYCIN 150 MG CAP
150 mg | ORAL | Status: AC
Start: 2018-09-09 — End: 2018-09-09
  Administered 2018-09-09: 22:00:00 via ORAL

## 2018-09-09 MED ORDER — CLINDAMYCIN 300 MG CAP
300 mg | ORAL_CAPSULE | Freq: Four times a day (QID) | ORAL | 0 refills | Status: AC
Start: 2018-09-09 — End: 2018-09-19

## 2018-09-09 MED ORDER — CLINDAMYCIN 150 MG CAP
150 mg | ORAL | Status: AC
Start: 2018-09-09 — End: 2018-09-09
  Administered 2018-09-09: 21:00:00 via ORAL

## 2018-09-09 MED ORDER — DEXAMETHASONE 4 MG TAB
4 mg | ORAL | Status: AC
Start: 2018-09-09 — End: 2018-09-09
  Administered 2018-09-09: 21:00:00 via ORAL

## 2018-09-09 MED FILL — CLINDAMYCIN 150 MG CAP: 150 mg | ORAL | Qty: 1

## 2018-09-09 MED FILL — DEXAMETHASONE 4 MG TAB: 4 mg | ORAL | Qty: 3

## 2018-09-09 MED FILL — CLINDAMYCIN 150 MG CAP: 150 mg | ORAL | Qty: 4

## 2018-09-09 NOTE — Other (Signed)
Otherwise healthy 32 year old female presents for evaluation of sore throat. She reports the sore throat started 2 days ago. Painful on left side only. It worsened yesterday.  She felt fatigued yesterday and had difficulty sleeping because of the pain. She reports it is very painful to swallow but is not experiencing difficulty swallowing or controlling her secretions.   Voice is mildly muffled.   She reports she has had sweats, but denies known fevers.  No rigors chills.  No trouble opening her mouth.  She has swollen glands in her neck that are painful, but denies neck stiffness. Has had mild sinus congestion as well.  Denies shortness of breath or difficulty breathing.  Denies chest pain, abdominal pain, nausea, vomiting, changes in bowels.  No skin rashes. She does have a history of recurring strep.  She did take ibuprofen prior to triage.  Otherwise healthy.     She is eating a popsicle in the exam room.              History reviewed. No pertinent past medical history.     History reviewed. No pertinent surgical history.      History reviewed. No pertinent family history.     Social History     Socioeconomic History   ??? Marital status: SINGLE     Spouse name: Not on file   ??? Number of children: Not on file   ??? Years of education: Not on file   ??? Highest education level: Not on file   Occupational History   ??? Not on file   Social Needs   ??? Financial resource strain: Not on file   ??? Food insecurity:     Worry: Not on file     Inability: Not on file   ??? Transportation needs:     Medical: Not on file     Non-medical: Not on file   Tobacco Use   ??? Smoking status: Current Every Day Smoker     Packs/day: 0.25   ??? Smokeless tobacco: Never Used   Substance and Sexual Activity   ??? Alcohol use: Yes     Comment: rarely   ??? Drug use: Yes     Types: Marijuana   ??? Sexual activity: Not on file   Lifestyle   ??? Physical activity:     Days per week: Not on file     Minutes per session: Not on file   ??? Stress: Not on file    Relationships   ??? Social connections:     Talks on phone: Not on file     Gets together: Not on file     Attends religious service: Not on file     Active member of club or organization: Not on file     Attends meetings of clubs or organizations: Not on file     Relationship status: Not on file   ??? Intimate partner violence:     Fear of current or ex partner: Not on file     Emotionally abused: Not on file     Physically abused: Not on file     Forced sexual activity: Not on file   Other Topics Concern   ??? Not on file   Social History Narrative   ??? Not on file                ALLERGIES: Codeine and Hydrocodone    Review of Systems   Constitutional: Positive for diaphoresis and fatigue. Negative for chills  and fever.   HENT: Positive for congestion, sore throat and voice change. Negative for drooling, ear pain, facial swelling, mouth sores, sinus pressure, sinus pain and trouble swallowing.    Eyes: Negative.    Respiratory: Negative.  Negative for cough, shortness of breath, wheezing and stridor.    Cardiovascular: Negative.    Gastrointestinal: Negative.    Musculoskeletal: Negative.  Negative for neck stiffness.   Skin: Negative.    Neurological: Negative.        Vitals:    09/09/18 1451   BP: 112/77   Pulse: 82   Resp: 14   Temp: 98.1 ??F (36.7 ??C)   SpO2: 97%       Physical Exam  Vitals signs and nursing note reviewed.       GENERAL: well-developed, well-nourished, in no distress  HEAD: atraumatic, normocephalic,  non-tender frontal sinuses & maxillary sinuses on percussion;  EYES: PERRL, no eye discharge or injection  EARS: bilateral tympanic membranes pearly gray, non-bulging, normal light reflex; no tenderness, edema, or erythema over the mastoids bilaterally, no tenderness of the tragus , helix, or pinna; ear canal is nonerythematous & nonedematous bilaterally; no pre-auricular or postauricular lymphadenopathy  NOSE: nasal turbinates non-swollen, moist without redness or discharge   OROPHARYNX:  Left tonsil erythematous; exudates noted on the left tonsil; small exudate noted on right tonsil; left tonsil 2+; right tonsil 1+; buccal mucosa moist, oropharynx clear; no posterior pharyngeal cobblestoning; uvula midline, airway patent; No drooling, no tri-podding  MOUTH: No gingival edema or erythema.  NECK:   Positive anterior cervical lymphadenopathy bilaterally; supple, full ROM, no stridor,   LUNGS: clear to auscultation bilaterally, no wheezes, rales or rhonchi;  no accessory retraction or nasal flaring; good air movement throughout all lung fields  HEART:  Regular rate and rhythm.    ABDOMEN: Abdomen is soft and nontender;    EXTREMITY: Radial pulses 2+. No peripheral edema; capillary refill less than 2 seconds  NEURO:  Alert and oriented x3 Strength is 5 out of 5, sensation grossly intact,   SKIN:  Warm and dry; No rashes, erythema, papules, pustules, or vesicles grossly seen          MDM  Number of Diagnoses or Management Options  Peritonsillar cellulitis:   Diagnosis management comments: This is a 32 year old female who presents for evaluation of very painful left-sided sore throat for the past 2 days as well as fatigue.    Patient afebrile, vitals are stable, patient is nontoxic appearing, and in no acute distress.  Patient appears well hydrated on exam.    Patient shows no signs of respiratory distress in the exam room.  She exhibits no difficulty controlling her secretions.  She is sitting upright.  She does have mild muffling of her voice.   Left tonsils erythematous, edematous, and has exudates.  Right tonsil fairly unremarkable. She has cervical lymphadenopathy, left side worse than right.  Uvula is midline.  Airway is patent.  She has no trismus or difficulty controlling her secretions.   No meningismus. No evidence of a deeper tissue infection, retropharyngeal abscess, or epiglottitis.   The left anterior pillar appears erythematous and indurated.  There is no  fluctuance.  It is not bulging anteriorly to suggest abscess.  I do not suspect peritonsillar abscess, however I am concerned patient has a peritonsillar cellulitis.     After discussion of risks and benefits, will proceed with treatment with clindamycin.  Patient given loading dose of 600 mg here in the urgent  care clinic prior to departure.  She will be prescribed a 10 day course of clindamycin.  She is also given a 1 time dose of dexamethasone.  I have advised her to use a probiotic for 3 months to help prevent antibiotic associated diarrhea and C difficile.  Have also advised her to gargle ibuprofen elixir 400 mg every 6 hr before swallowing, as this has a topical anesthetic and anti-inflammatory effect.   Discussed the need for very close follow-up with her PCP tomorrow.  Counseled to go to the emergency department for any new or worsening symptoms. Reviewed symptoms and signs that warrant emergent medical attention.    Medication risks,  benefits, actions, and common side effects were reviewed with the patient and/or guardian.  Patient denies any chance of pregnancy.     The plan was developed with patient and/or guardian after reviewing risks and benefits, of medication/medical therapy options.  Patient and/or guardian agrees with the plan of care.  All questions, concerns addressed and answered.  Advised on signs and symptoms warranting follow-up or emergency care, see after visit summary instructions for recommended symptomatic care, further details regarding follow up, and plan of care.      Patient/guardian was offered written discharge instructions and declined these.                  Procedures

## 2018-09-09 NOTE — Other (Signed)
Patient called in stating she has had some difficulty keeping her clindamycin down, and has vomited up doses she took over night and first thing this morning.     She was able to keep the dose down that she took 5 min ago so far, but is starting to feel secure stomachache and.    She reports that for the past 15 min her face is been hot and flushed red.  She reports she has also had some new shortness of breath since this morning.  Denies lip swelling, tongue swelling, or sensation of throat swelling.  Denies wheezing.   I advised patient she may be having a medication reaction and needs emergent medical attention if she is having shortness of breath.   She is talking clearly in full sentences without muffling of voice. I recommended she call 911  To go to the emergency department by EMS for further evaluation.    She tells me she is unable to afford ambulance.  I advised my medical advice is to call ambulance and seek emergent medical attention if she is having shortness of breath,   As this could be a sign of a severe anaphylactic reaction and I cannot assess her over the phone..  I discussed risk of worsening severe allergic reaction which could result in permanent deformity, death, or harm to others if she drives herself to the emergency department.    She voices understanding of these risks. She states she will go to the emergency department.

## 2018-09-09 NOTE — Other (Signed)
Patient reports Friday waking up to a sore throat that went away, Saturday afternoon she became fatigued and had difficulty sleeping, her sore throat, white patches, and hoarse voice. She has been using ibuprofen.

## 2018-09-09 NOTE — ED Notes (Signed)
Patient reports Friday waking up to a sore throat that went away, Saturday afternoon she became fatigued and had difficulty sleeping, her sore throat, white patches, and hoarse voice. She has been using ibuprofen.

## 2018-09-09 NOTE — ED Provider Notes (Signed)
Otherwise healthy 32 year old female presents for evaluation of sore throat. She reports the sore throat started 2 days ago. Painful on left side only. It worsened yesterday.  She felt fatigued yesterday and had difficulty sleeping because of the pain. She reports it is very painful to swallow but is not experiencing difficulty swallowing or controlling her secretions.   Voice is mildly muffled.   She reports she has had sweats, but denies known fevers.  No rigors chills.  No trouble opening her mouth.  She has swollen glands in her neck that are painful, but denies neck stiffness. Has had mild sinus congestion as well.  Denies shortness of breath or difficulty breathing.  Denies chest pain, abdominal pain, nausea, vomiting, changes in bowels.  No skin rashes. She does have a history of recurring strep.  She did take ibuprofen prior to triage.  Otherwise healthy.     She is eating a popsicle in the exam room.              History reviewed. No pertinent past medical history.     History reviewed. No pertinent surgical history.      History reviewed. No pertinent family history.     Social History     Socioeconomic History   ??? Marital status: SINGLE     Spouse name: Not on file   ??? Number of children: Not on file   ??? Years of education: Not on file   ??? Highest education level: Not on file   Occupational History   ??? Not on file   Social Needs   ??? Financial resource strain: Not on file   ??? Food insecurity:     Worry: Not on file     Inability: Not on file   ??? Transportation needs:     Medical: Not on file     Non-medical: Not on file   Tobacco Use   ??? Smoking status: Current Every Day Smoker     Packs/day: 0.25   ??? Smokeless tobacco: Never Used   Substance and Sexual Activity   ??? Alcohol use: Yes     Comment: rarely   ??? Drug use: Yes     Types: Marijuana   ??? Sexual activity: Not on file   Lifestyle   ??? Physical activity:     Days per week: Not on file     Minutes per session: Not on file   ??? Stress: Not on file    Relationships   ??? Social connections:     Talks on phone: Not on file     Gets together: Not on file     Attends religious service: Not on file     Active member of club or organization: Not on file     Attends meetings of clubs or organizations: Not on file     Relationship status: Not on file   ??? Intimate partner violence:     Fear of current or ex partner: Not on file     Emotionally abused: Not on file     Physically abused: Not on file     Forced sexual activity: Not on file   Other Topics Concern   ??? Not on file   Social History Narrative   ??? Not on file                ALLERGIES: Codeine and Hydrocodone    Review of Systems   Constitutional: Positive for diaphoresis and fatigue. Negative for chills  and fever.   HENT: Positive for congestion, sore throat and voice change. Negative for drooling, ear pain, facial swelling, mouth sores, sinus pressure, sinus pain and trouble swallowing.    Eyes: Negative.    Respiratory: Negative.  Negative for cough, shortness of breath, wheezing and stridor.    Cardiovascular: Negative.    Gastrointestinal: Negative.    Musculoskeletal: Negative.  Negative for neck stiffness.   Skin: Negative.    Neurological: Negative.        Vitals:    09/09/18 1451   BP: 112/77   Pulse: 82   Resp: 14   Temp: 98.1 ??F (36.7 ??C)   SpO2: 97%       Physical Exam  Vitals signs and nursing note reviewed.       GENERAL: well-developed, well-nourished, in no distress  HEAD: atraumatic, normocephalic,  non-tender frontal sinuses & maxillary sinuses on percussion;  EYES: PERRL, no eye discharge or injection  EARS: bilateral tympanic membranes pearly gray, non-bulging, normal light reflex; no tenderness, edema, or erythema over the mastoids bilaterally, no tenderness of the tragus , helix, or pinna; ear canal is nonerythematous & nonedematous bilaterally; no pre-auricular or postauricular lymphadenopathy  NOSE: nasal turbinates non-swollen, moist without redness or discharge  OROPHARYNX:  Left tonsil  erythematous; exudates noted on the left tonsil; small exudate noted on right tonsil; left tonsil 2+; right tonsil 1+; buccal mucosa moist, oropharynx clear; no posterior pharyngeal cobblestoning; uvula midline, airway patent; No drooling, no tri-podding  MOUTH: No gingival edema or erythema.  NECK:   Positive anterior cervical lymphadenopathy bilaterally; supple, full ROM, no stridor,   LUNGS: clear to auscultation bilaterally, no wheezes, rales or rhonchi;  no accessory retraction or nasal flaring; good air movement throughout all lung fields  HEART:  Regular rate and rhythm.    ABDOMEN: Abdomen is soft and nontender;    EXTREMITY: Radial pulses 2+. No peripheral edema; capillary refill less than 2 seconds  NEURO:  Alert and oriented x3 Strength is 5 out of 5, sensation grossly intact,   SKIN:  Warm and dry; No rashes, erythema, papules, pustules, or vesicles grossly seen          MDM  Number of Diagnoses or Management Options  Peritonsillar cellulitis:   Diagnosis management comments: This is a 32 year old female who presents for evaluation of very painful left-sided sore throat for the past 2 days as well as fatigue.    Patient afebrile, vitals are stable, patient is nontoxic appearing, and in no acute distress.  Patient appears well hydrated on exam.    Patient shows no signs of respiratory distress in the exam room.  She exhibits no difficulty controlling her secretions.  She is sitting upright.  She does have mild muffling of her voice.   Left tonsils erythematous, edematous, and has exudates.  Right tonsil fairly unremarkable. She has cervical lymphadenopathy, left side worse than right.  Uvula is midline.  Airway is patent.  She has no trismus or difficulty controlling her secretions.   No meningismus. No evidence of a deeper tissue infection, retropharyngeal abscess, or epiglottitis.   The left anterior pillar appears erythematous and indurated.  There is no fluctuance.  It is not bulging anteriorly to  suggest abscess.  I do not suspect peritonsillar abscess, however I am concerned patient has a peritonsillar cellulitis.     After discussion of risks and benefits, will proceed with treatment with clindamycin.  Patient given loading dose of 600 mg here in the urgent  care clinic prior to departure.  She will be prescribed a 10 day course of clindamycin.  She is also given a 1 time dose of dexamethasone.  I have advised her to use a probiotic for 3 months to help prevent antibiotic associated diarrhea and C difficile.  Have also advised her to gargle ibuprofen elixir 400 mg every 6 hr before swallowing, as this has a topical anesthetic and anti-inflammatory effect.   Discussed the need for very close follow-up with her PCP tomorrow.  Counseled to go to the emergency department for any new or worsening symptoms. Reviewed symptoms and signs that warrant emergent medical attention.    Medication risks,  benefits, actions, and common side effects were reviewed with the patient and/or guardian.  Patient denies any chance of pregnancy.     The plan was developed with patient and/or guardian after reviewing risks and benefits, of medication/medical therapy options.  Patient and/or guardian agrees with the plan of care.  All questions, concerns addressed and answered.  Advised on signs and symptoms warranting follow-up or emergency care, see after visit summary instructions for recommended symptomatic care, further details regarding follow up, and plan of care.      Patient/guardian was offered written discharge instructions and declined these.                  Procedures

## 2018-09-09 NOTE — ED Notes (Signed)
Patient called in stating she has had some difficulty keeping her clindamycin down, and has vomited up doses she took over night and first thing this morning.     She was able to keep the dose down that she took 5 min ago so far, but is starting to feel secure stomachache and.    She reports that for the past 15 min her face is been hot and flushed red.  She reports she has also had some new shortness of breath since this morning.  Denies lip swelling, tongue swelling, or sensation of throat swelling.  Denies wheezing.   I advised patient she may be having a medication reaction and needs emergent medical attention if she is having shortness of breath.   She is talking clearly in full sentences without muffling of voice. I recommended she call 911  To go to the emergency department by EMS for further evaluation.    She tells me she is unable to afford ambulance.  I advised my medical advice is to call ambulance and seek emergent medical attention if she is having shortness of breath,   As this could be a sign of a severe anaphylactic reaction and I cannot assess her over the phone..  I discussed risk of worsening severe allergic reaction which could result in permanent deformity, death, or harm to others if she drives herself to the emergency department.    She voices understanding of these risks. She states she will go to the emergency department.

## 2018-09-11 LAB — CULTURE, STREP THROAT

## 2019-05-03 NOTE — Telephone Encounter (Signed)
LM for pt that we could fax the release or mail it to her.  Pt to call office to discuss more.  Para March, CMA

## 2019-05-03 NOTE — Telephone Encounter (Signed)
Pt name and DOB verified.    Pt calling stating that she is moving to [place] and is going to an [facility] out there called [facility] located in [place].     Pt states that they will not see the pt until they get all of her records as pt has an emergency c-section with her first child and wants to have a bvac with her next.     Please advise as pt is unsure how to go about this when it comes to moving them over because the pt is unable to come to office as she lives in [place] and states that she is moving soon.     [phone] (home) [phone] (work)    Georgiana Spinner
